# Patient Record
Sex: Female | Born: 1935 | Race: White | Hispanic: No | Marital: Single
Health system: Southern US, Community
[De-identification: ages and names within clinical notes are randomized; demographics above are authoritative.]

---

## 2007-07-05 DIAGNOSIS — G5600 Carpal tunnel syndrome, unspecified upper limb: Secondary | ICD-10-CM | POA: Insufficient documentation

## 2018-05-18 ENCOUNTER — Other Ambulatory Visit: Payer: Self-pay | Admitting: Family Medicine

## 2018-05-18 ENCOUNTER — Other Ambulatory Visit (HOSPITAL_COMMUNITY): Payer: Self-pay | Admitting: Family Medicine

## 2018-05-18 DIAGNOSIS — R55 Syncope and collapse: Secondary | ICD-10-CM

## 2018-05-24 ENCOUNTER — Ambulatory Visit (HOSPITAL_COMMUNITY)
Admission: RE | Admit: 2018-05-24 | Discharge: 2018-05-24 | Disposition: A | Discharge status: Home / Self Care | Payer: Medicare HMO | Source: Ambulatory Visit | Attending: Family Medicine | Admitting: Family Medicine

## 2018-05-24 DIAGNOSIS — R55 Syncope and collapse: Secondary | ICD-10-CM | POA: Diagnosis present

## 2018-05-24 DIAGNOSIS — I6523 Occlusion and stenosis of bilateral carotid arteries: Secondary | ICD-10-CM | POA: Diagnosis not present

## 2018-05-25 ENCOUNTER — Other Ambulatory Visit (HOSPITAL_COMMUNITY): Payer: Self-pay | Admitting: Family Medicine

## 2018-05-25 DIAGNOSIS — R55 Syncope and collapse: Secondary | ICD-10-CM

## 2018-06-04 ENCOUNTER — Ambulatory Visit (HOSPITAL_COMMUNITY): Payer: Medicare HMO

## 2019-07-19 IMAGING — US US CAROTID DUPLEX BILAT
1 series · 13 of 24 positions shown · non-contrast
Comparison: None.

CLINICAL DATA: Syncope

EXAM:
BILATERAL CAROTID DUPLEX ULTRASOUND
TECHNIQUE: Gray scale imaging, color Doppler and duplex ultrasound were
performed of bilateral carotid and vertebral arteries in the neck.

[Series 1: us carotid duplex bilat · 13 of 69 slices shown]
[im 1/69]
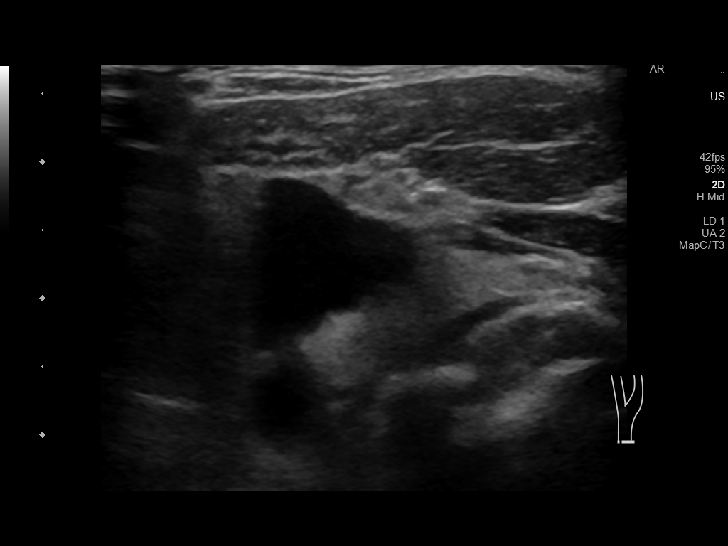
[im 6/69]
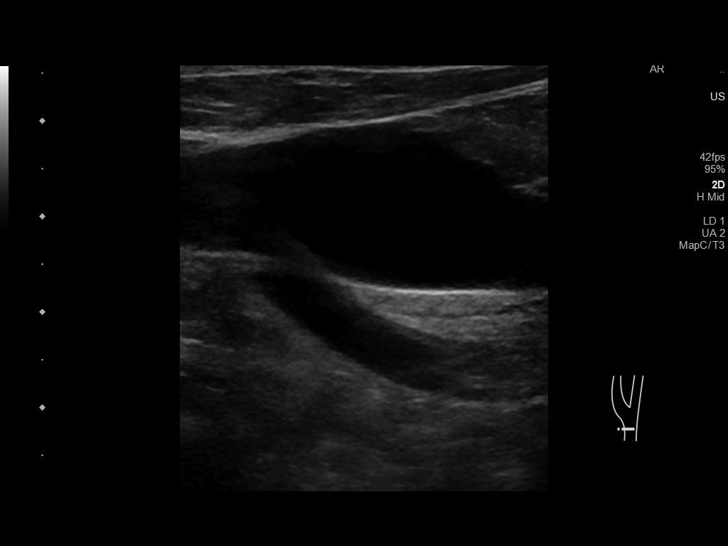
[im 12/69]
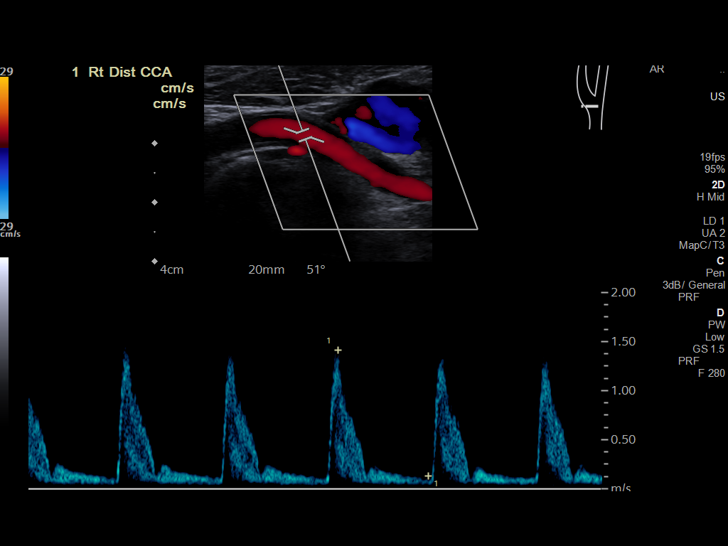
[im 18/69]
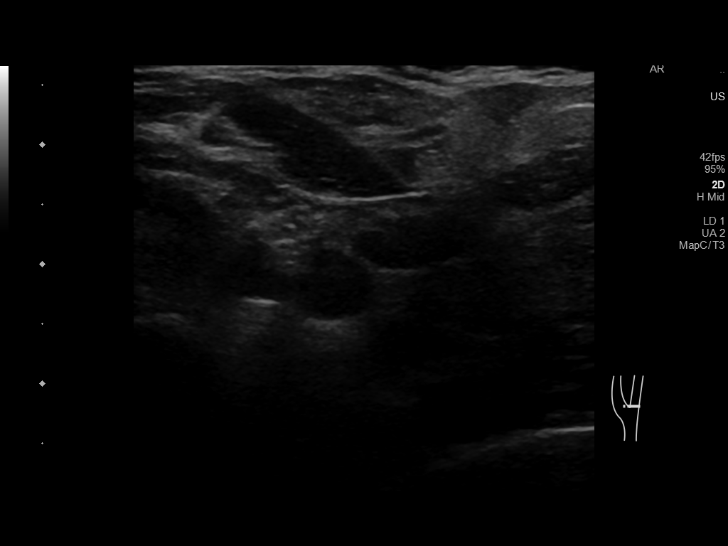
[im 24/69]
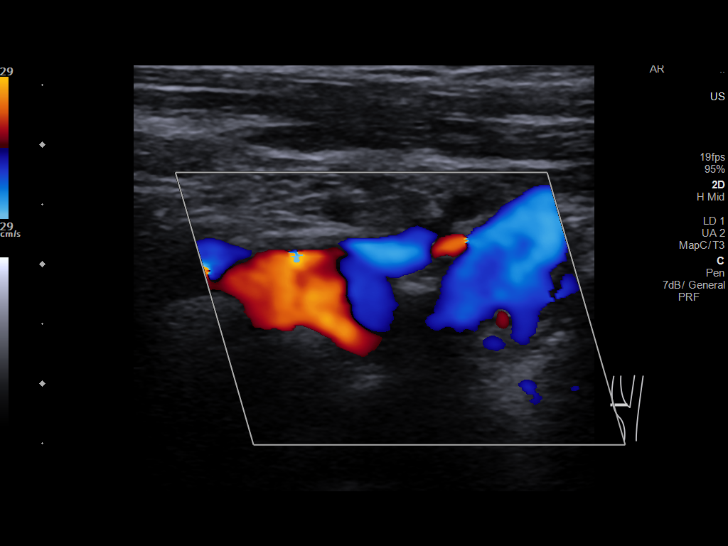
[im 30/69]
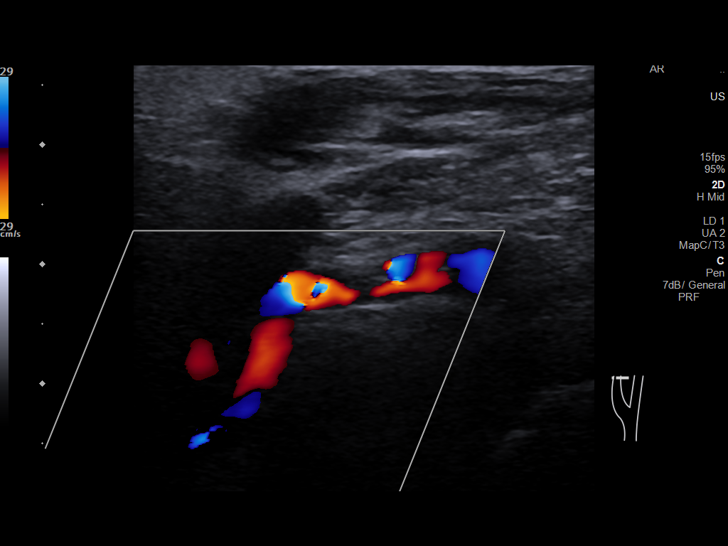
[im 36/69]
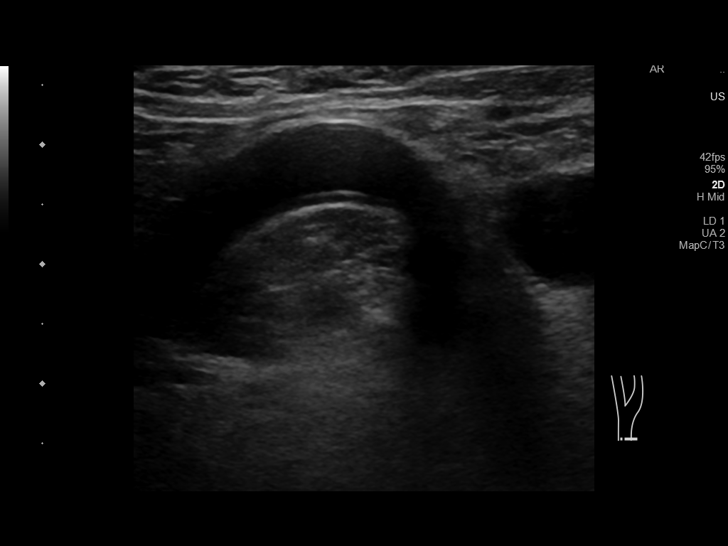
[im 39/69]
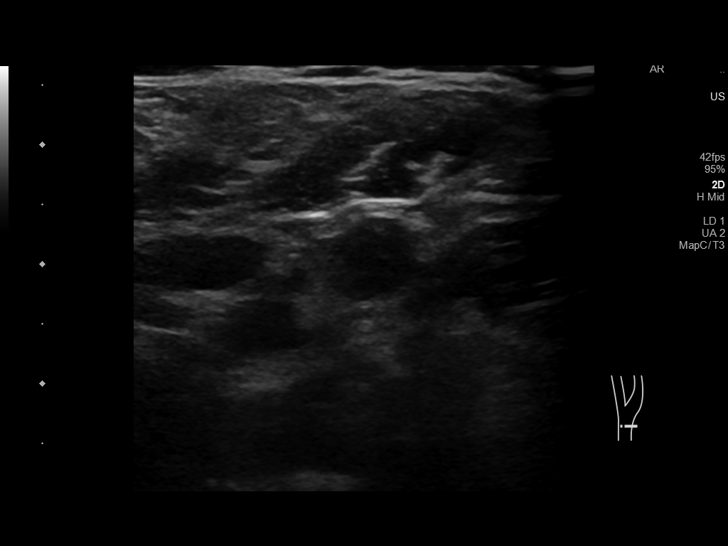
[im 45/69]
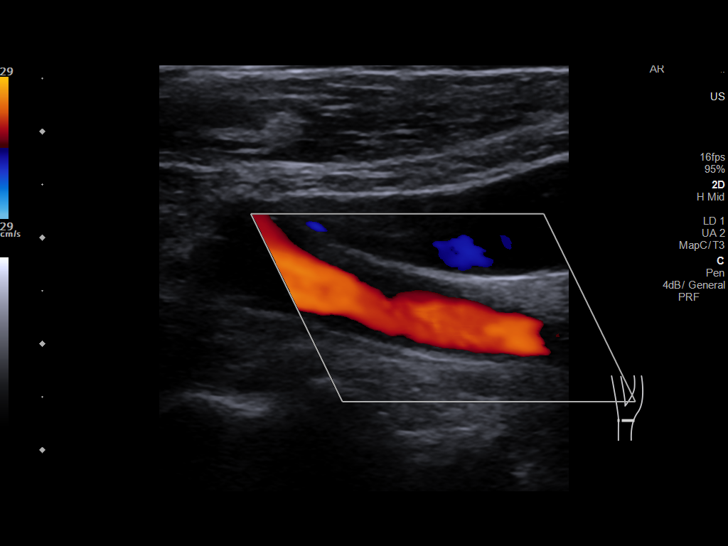
[im 51/69]
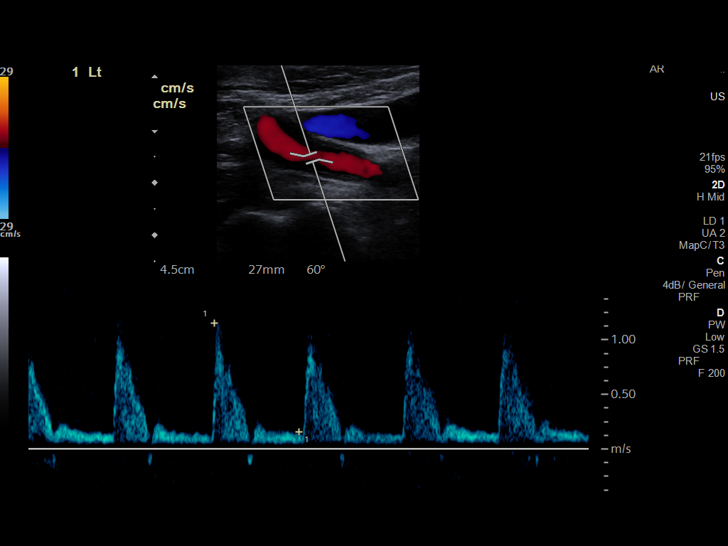
[im 57/69]
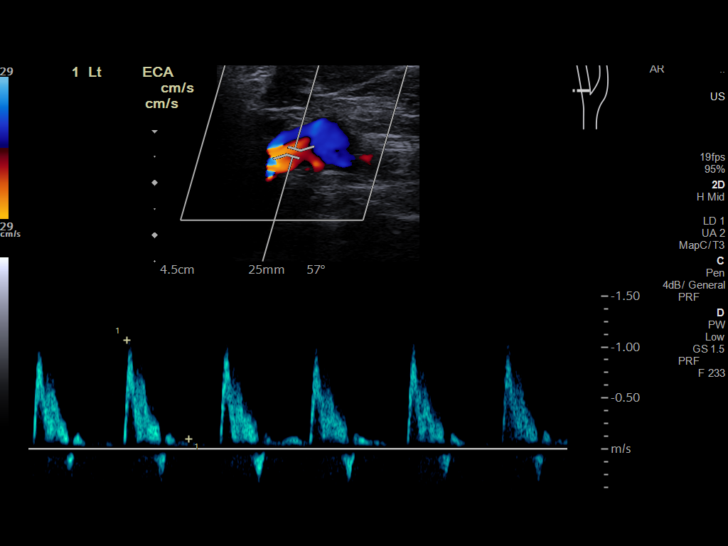
[im 63/69]
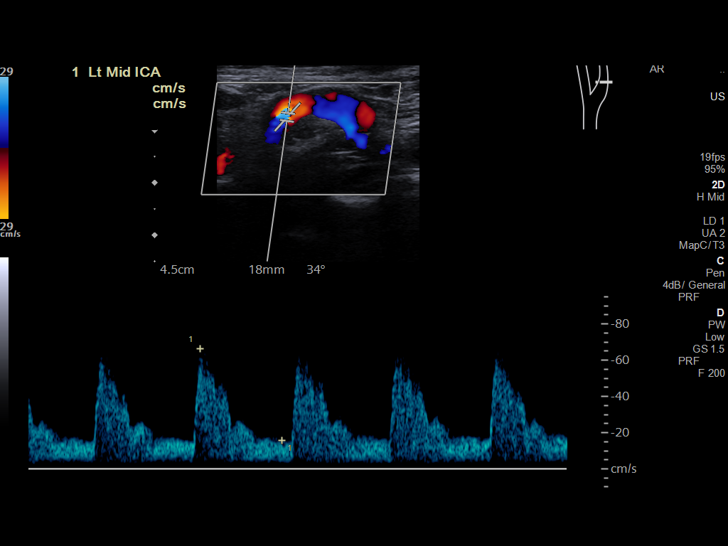
[im 69/69]
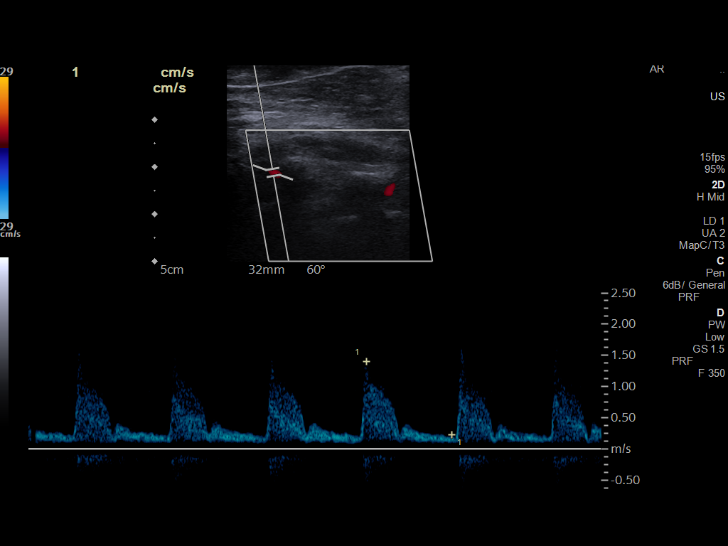

[13 of 24 positions shown; findings below may reference images not displayed]

FINDINGS: Criteria: Quantification of carotid stenosis is based on velocity
parameters that correlate the residual internal carotid diameter
with NASCET-based stenosis levels, using the diameter of the distal
internal carotid lumen as the denominator for stenosis measurement.

The following velocity measurements were obtained:

RIGHT

ICA: 108/24 cm/sec

CCA: 128/13 cm/sec

SYSTOLIC ICA/CCA RATIO:

ECA: 74 cm/sec

LEFT

ICA: 106/16 cm/sec

CCA: 119/13 cm/sec

SYSTOLIC ICA/CCA RATIO:

ECA: 107 cm/sec

RIGHT CAROTID ARTERY: Mild tortuosity. Calcified plaque in the
proximal ICA with some distal acoustic shadowing. Normal waveforms
and color Doppler signal.

RIGHT VERTEBRAL ARTERY:  Normal flow direction and waveform.

LEFT CAROTID ARTERY: Moderate tortuosity. Mild eccentric
nonocclusive plaque in the proximal ICA. Normal waveforms and color
Doppler signal.

LEFT VERTEBRAL ARTERY:  Normal flow direction and waveform.
IMPRESSION: 1. Mild bilateral proximal ICA plaque resulting in less than 50%
diameter stenosis.
2.  Antegrade bilateral vertebral arterial flow.

## 2019-11-14 ENCOUNTER — Ambulatory Visit: Payer: Medicare HMO | Attending: Internal Medicine

## 2019-11-14 DIAGNOSIS — Z23 Encounter for immunization: Secondary | ICD-10-CM

## 2019-11-14 NOTE — Progress Notes (Signed)
   Covid-19 Vaccination Clinic  Name:  Angela Mclaughlin    MRN: [id number] DOB: [birth year]  11/14/2019  Angela Mclaughlin was observed post Covid-19 immunization for 15 minutes without incident. She was provided with Vaccine Information Sheet and instruction to access the V-Safe system.   Angela Mclaughlin was instructed to call 911 with any severe reactions post vaccine: Marland Kitchen Difficulty breathing  . Swelling of face and throat  . A fast heartbeat  . A bad rash all over body  . Dizziness and weakness   Immunizations Administered    Name Date Dose VIS Date Route   Moderna COVID-19 Vaccine 11/14/2019 11:59 AM 0.5 mL 06/2019 Intramuscular   Manufacturer: Moderna   Lot: [id number]   NDC: 36122-449-75

## 2019-12-12 ENCOUNTER — Ambulatory Visit: Payer: Medicare HMO | Attending: Internal Medicine

## 2019-12-12 DIAGNOSIS — Z23 Encounter for immunization: Secondary | ICD-10-CM

## 2019-12-12 NOTE — Progress Notes (Signed)
   Covid-19 Vaccination Clinic  Name:  Angela Mclaughlin    MRN: [id number] DOB: February 23, 1935  12/12/2019  Angela Mclaughlin was observed post Covid-19 immunization for 15 minutes without incident. She was provided with Vaccine Information Sheet and instruction to access the V-Safe system.   Angela Mclaughlin was instructed to call 911 with any severe reactions post vaccine: Marland Kitchen Difficulty breathing  . Swelling of face and throat  . A fast heartbeat  . A bad rash all over body  . Dizziness and weakness   Immunizations Administered    Name Date Dose VIS Date Route   Moderna COVID-19 Vaccine 12/12/2019 12:22 PM 0.5 mL 06/2019 Intramuscular   Manufacturer: Moderna   Lot: [id number]   NDC: 95320-233-43

## 2022-11-08 DIAGNOSIS — M79676 Pain in unspecified toe(s): Secondary | ICD-10-CM | POA: Diagnosis not present

## 2022-11-08 DIAGNOSIS — B351 Tinea unguium: Secondary | ICD-10-CM | POA: Diagnosis not present

## 2022-11-21 DIAGNOSIS — R0982 Postnasal drip: Secondary | ICD-10-CM | POA: Diagnosis not present

## 2022-11-21 DIAGNOSIS — J00 Acute nasopharyngitis [common cold]: Secondary | ICD-10-CM | POA: Diagnosis not present

## 2023-03-30 ENCOUNTER — Encounter: Payer: Self-pay | Admitting: Family Medicine

## 2023-03-30 ENCOUNTER — Ambulatory Visit (INDEPENDENT_AMBULATORY_CARE_PROVIDER_SITE_OTHER): Payer: 59 | Admitting: Family Medicine

## 2023-03-30 VITALS — BP 153/73 | HR 81 | Temp 98.7°F | Ht 63.0 in | Wt 145.0 lb

## 2023-03-30 DIAGNOSIS — R2689 Other abnormalities of gait and mobility: Secondary | ICD-10-CM

## 2023-03-30 DIAGNOSIS — Z741 Need for assistance with personal care: Secondary | ICD-10-CM | POA: Diagnosis not present

## 2023-03-30 DIAGNOSIS — Z136 Encounter for screening for cardiovascular disorders: Secondary | ICD-10-CM

## 2023-03-30 DIAGNOSIS — R030 Elevated blood-pressure reading, without diagnosis of hypertension: Secondary | ICD-10-CM

## 2023-03-30 DIAGNOSIS — R7303 Prediabetes: Secondary | ICD-10-CM | POA: Diagnosis not present

## 2023-03-30 DIAGNOSIS — R600 Localized edema: Secondary | ICD-10-CM

## 2023-03-30 DIAGNOSIS — R051 Acute cough: Secondary | ICD-10-CM | POA: Diagnosis not present

## 2023-03-30 DIAGNOSIS — R6889 Other general symptoms and signs: Secondary | ICD-10-CM | POA: Diagnosis not present

## 2023-03-30 DIAGNOSIS — Z1331 Encounter for screening for depression: Secondary | ICD-10-CM

## 2023-03-30 LAB — CBC WITH DIFFERENTIAL/PLATELET
Basophils Absolute: 0 10*3/uL (ref 0.0–0.2)
Basos: 0 %
EOS (ABSOLUTE): 0.1 10*3/uL (ref 0.0–0.4)
Eos: 1 %
Hematocrit: 35.9 % (ref 34.0–46.6)
Hemoglobin: 11.5 g/dL (ref 11.1–15.9)
Immature Grans (Abs): 0 10*3/uL (ref 0.0–0.1)
Immature Granulocytes: 0 %
Lymphocytes Absolute: 1.2 10*3/uL (ref 0.7–3.1)
Lymphs: 23 %
MCH: 26.6 pg (ref 26.6–33.0)
MCHC: 32 g/dL (ref 31.5–35.7)
MCV: 83 fL (ref 79–97)
Monocytes Absolute: 0.5 10*3/uL (ref 0.1–0.9)
Monocytes: 9 %
Neutrophils Absolute: 3.6 10*3/uL (ref 1.4–7.0)
Neutrophils: 67 %
Platelets: 177 10*3/uL (ref 150–450)
RBC: 4.33 x10E6/uL (ref 3.77–5.28)
RDW: 13 % (ref 11.7–15.4)
WBC: 5.4 10*3/uL (ref 3.4–10.8)

## 2023-03-30 LAB — LIPID PANEL
Chol/HDL Ratio: 4.8 ratio — ABNORMAL HIGH (ref 0.0–4.4)
Cholesterol, Total: 227 mg/dL — ABNORMAL HIGH (ref 100–199)
HDL: 47 mg/dL (ref >39)
LDL Chol Calc (NIH): 167 mg/dL — ABNORMAL HIGH (ref 0–99)
Triglycerides: 72 mg/dL (ref 0–149)
VLDL Cholesterol Cal: 13 mg/dL (ref 5–40)

## 2023-03-30 LAB — CMP14+EGFR
ALT: 12 IU/L (ref 0–32)
AST: 17 IU/L (ref 0–40)
Albumin: 4.1 g/dL (ref 3.7–4.7)
Alkaline Phosphatase: 71 IU/L (ref 44–121)
BUN/Creatinine Ratio: 15 (ref 12–28)
BUN: 12 mg/dL (ref 8–27)
Bilirubin Total: 0.3 mg/dL (ref 0.0–1.2)
CO2: 26 mmol/L (ref 20–29)
Chloride: 102 mmol/L (ref 96–106)
Creatinine, Ser: 0.79 mg/dL (ref 0.57–1.00)
Globulin, Total: 2.8 g/dL (ref 1.5–4.5)
Glucose: 91 mg/dL (ref 70–99)
Potassium: 3.8 mmol/L (ref 3.5–5.2)
Sodium: 140 mmol/L (ref 134–144)
Total Protein: 6.9 g/dL (ref 6.0–8.5)
eGFR: 73 mL/min/{1.73_m2} (ref >59)

## 2023-03-30 LAB — VITAMIN D 25 HYDROXY (VIT D DEFICIENCY, FRACTURES)

## 2023-03-30 LAB — BAYER DCA HB A1C WAIVED: HB A1C (BAYER DCA - WAIVED): 6 % — ABNORMAL HIGH (ref 4.8–5.6)

## 2023-03-30 LAB — TSH

## 2023-03-30 LAB — VITAMIN B12

## 2023-03-30 MED ORDER — GUAIFENESIN ER 600 MG PO TB12
600.0000 mg | ORAL_TABLET | Freq: Two times a day (BID) | ORAL | 0 refills | Status: AC
Start: 2023-03-30 — End: ?

## 2023-03-30 NOTE — Patient Instructions (Signed)
Monitoring your BP at home   Your BP was elevated today in office  Please keep a log of your BP at home.  The best time to take BP is 1st thing in the morning after waking.  Sit for 5 minutes with feet flat on the floor, arm at heart level.  Options for BP cuffs are at [employer], [employer], [facility], [facility] & [facility].  We will review measurements at follow up and determine plan for BP management.  If you have access, you can send a message in MyChart with your measurements prior to your follow up appointment.  The brand I recommend to get is Omron (Bronze).
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
## 2023-03-31 NOTE — Progress Notes (Signed)
Cholesterol is elevated. Diet encouraged - increase intake of fresh fruits and vegetables, increase intake of lean proteins. Bake, broil, or grill foods. Avoid fried, greasy, and fatty foods. Avoid fast foods. Increase intake of fiber-rich whole grains. Exercise encouraged - at least 150 minutes per week and advance as tolerated. Can also try red yeast rice and we will recheck in 3 months.  Vitamin B12 is elevated, if she is taking supplementation, may reduce it.  Remains in prediabetes. All other labs normal.   The ASCVD Risk score (Arnett DK, et al., 2019) failed to calculate for the following reasons:   The 2019 ASCVD risk score is only valid for ages 11 to 50

## 2023-04-19 NOTE — Progress Notes (Signed)
Patient is aware and reports she will call her insurance company and let up know what is needed from [place].

## 2023-05-02 ENCOUNTER — Ambulatory Visit (INDEPENDENT_AMBULATORY_CARE_PROVIDER_SITE_OTHER): Payer: 59 | Admitting: Family Medicine

## 2023-05-02 ENCOUNTER — Encounter: Payer: Self-pay | Admitting: Family Medicine

## 2023-05-02 VITALS — BP 129/70 | HR 71 | Temp 98.9°F | Ht 63.0 in | Wt 148.0 lb

## 2023-05-02 DIAGNOSIS — R600 Localized edema: Secondary | ICD-10-CM | POA: Diagnosis not present

## 2023-05-02 DIAGNOSIS — Z741 Need for assistance with personal care: Secondary | ICD-10-CM | POA: Diagnosis not present

## 2023-05-02 DIAGNOSIS — R7303 Prediabetes: Secondary | ICD-10-CM

## 2023-05-02 DIAGNOSIS — R2689 Other abnormalities of gait and mobility: Secondary | ICD-10-CM

## 2023-05-02 DIAGNOSIS — L819 Disorder of pigmentation, unspecified: Secondary | ICD-10-CM | POA: Diagnosis not present

## 2023-05-02 DIAGNOSIS — R351 Nocturia: Secondary | ICD-10-CM

## 2023-05-02 DIAGNOSIS — Z1331 Encounter for screening for depression: Secondary | ICD-10-CM

## 2023-05-02 MED ORDER — OXYBUTYNIN CHLORIDE ER 5 MG PO TB24: 5.0000 mg | ORAL_TABLET | Freq: Every day | ORAL | 0 refills | Status: DC

## 2023-05-02 NOTE — Progress Notes (Signed)
Subjective:  Patient ID: Angela Mclaughlin, female    DOB: [birth year], 88 y.o.   MRN: [id number]  Patient Care Team: Arrie Senate, FNP as PCP - General (Family Medicine)   Chief Complaint:  Medical Management of Chronic Issues  HPI: Angela Mclaughlin is a 88 y.o. female presenting on 05/02/2023 for Medical Management of Chronic Issues 1. Prediabetes States that she is not currently cooking for herself. She is relying on other people for her food. She hopes to get in with PT and increase her strength so that she is able to cook. She reports that she has kept her A1C in prediabetes range for several years.    2. Bilateral lower extremity edema States that they feel slightly better. She is more concerned abut the skin coloration than the swelling. She does not wear compression stockings. She has not followed up with vascular at this time. She is waiting for her neighbor to coordinate schedules with her so that she can have a ride to the appt.   3. Imbalance/Requires assistance with activities of daily living (ADL) Has referral with PT, needs to make an appt. Continues to use bilateral canes. She denies any falls. She is not able to complete her ADLs. Patient was brought in today by her neighbor who provides some of patient history. Patient has one son that she sees on occasion, he is coming over on Thursday.   4. Encounter for screening for depression Denies depression. Denies thought of self harm.   5. Urinary frequency  States that she can control her bladder, but she is up every night every 2 hours. Reports that this started 3-4 yrs ago. Denies any change in odor, pain with urination, or low back pain.   Relevant past medical, surgical, family, and social history reviewed and updated as indicated.  Allergies and medications reviewed and updated. Data reviewed: Chart in Epic.   No past medical history on file.  No past surgical history on file.  Social History    Socioeconomic History   Marital status: Single    Spouse name: Not on file   Number of children: Not on file   Years of education: Not on file   Highest education level: Not on file  Occupational History   Not on file  Tobacco Use   Smoking status: Never   Smokeless tobacco: Never  Substance and Sexual Activity   Alcohol use: Not Currently   Drug use: Not Currently   Sexual activity: Not Currently  Other Topics Concern   Not on file  Social History Narrative   Not on file   Social Determinants of Health   Financial Resource Strain: Not on file  Food Insecurity: Not on file  Transportation Needs: Not on file  Physical Activity: Not on file  Stress: Not on file  Social Connections: Not on file  Intimate Partner Violence: Not on file    Outpatient Encounter Medications as of 05/02/2023  Medication Sig   CALCIUM PO Take by mouth.   guaiFENesin (MUCINEX) 600 MG 12 hr tablet Take 1 tablet (600 mg total) by mouth 2 (two) times daily.   Multiple Vitamins-Minerals (MULTIVITAMIN WOMEN 50+ PO) Take by mouth.   POTASSIUM PO Take by mouth.   VITAMIN D PO Take by mouth.   No facility-administered encounter medications on file as of 05/02/2023.    No Known Allergies  Review of Systems As per HPI Objective:  BP 129/70   Pulse 71   Temp 98.9  F (37.2 C)   Ht 5\' 3"  (1.6 m)   Wt 148 lb (67.1 kg)   SpO2 98%   BMI 26.22 kg/m    Wt Readings from Last 3 Encounters:  05/02/23 148 lb (67.1 kg)  03/30/23 145 lb (65.8 kg)   Physical Exam Constitutional:      General: She is awake. She is not in acute distress.    Appearance: Normal appearance. She is well-developed and well-groomed. She is not ill-appearing, toxic-appearing or diaphoretic.  Eyes:     Comments: Wears glasses   Cardiovascular:     Rate and Rhythm: Normal rate and regular rhythm.     Pulses: Normal pulses.          Radial pulses are 2+ on the right side and 2+ on the left side.       Posterior tibial  pulses are 2+ on the right side and 2+ on the left side.     Heart sounds: Normal heart sounds. No murmur heard.    No gallop.  Pulmonary:     Effort: Pulmonary effort is normal. No respiratory distress.     Breath sounds: Normal breath sounds. No stridor. No wheezing, rhonchi or rales.  Musculoskeletal:     Cervical back: Full passive range of motion without pain and neck supple.     Right lower leg: 1+ Edema present.     Left lower leg: 1+ Edema present.     Comments: Generalized weakness, uses canes to walk  Skin:    General: Skin is warm.     Capillary Refill: Capillary refill takes less than 2 seconds.  Neurological:     General: No focal deficit present.     Mental Status: She is alert, oriented to person, place, and time and easily aroused. Mental status is at baseline.     GCS: GCS eye subscore is 4. GCS verbal subscore is 5. GCS motor subscore is 6.     Motor: No weakness.  Psychiatric:        Attention and Perception: Attention and perception normal.        Mood and Affect: Mood and affect normal.        Speech: Speech normal.        Behavior: Behavior normal. Behavior is cooperative.        Thought Content: Thought content normal. Thought content does not include homicidal or suicidal ideation. Thought content does not include homicidal or suicidal plan.        Cognition and Memory: Cognition and memory normal.        Judgment: Judgment normal.      Results for orders placed or performed in visit on 03/30/23  Bayer DCA Hb A1c Waived  Result Value Ref Range   HB A1C (BAYER DCA - WAIVED) 6.0 (H) 4.8 - 5.6 %  CBC with Differential/Platelet  Result Value Ref Range   WBC 5.4 3.4 - 10.8 x10E3/uL   RBC 4.33 3.77 - 5.28 x10E6/uL   Hemoglobin 11.5 11.1 - 15.9 g/dL   Hematocrit 84.6 96.2 - 46.6 %   MCV 83 79 - 97 fL   MCH 26.6 26.6 - 33.0 pg   MCHC 32.0 31.5 - 35.7 g/dL   RDW 95.2 84.1 - 32.4 %   Platelets 177 150 - 450 x10E3/uL   Neutrophils 67 Not Estab. %   Lymphs  23 Not Estab. %   Monocytes 9 Not Estab. %   Eos 1 Not Estab. %   Basos  0 Not Estab. %   Neutrophils Absolute 3.6 1.4 - 7.0 x10E3/uL   Lymphocytes Absolute 1.2 0.7 - 3.1 x10E3/uL   Monocytes Absolute 0.5 0.1 - 0.9 x10E3/uL   EOS (ABSOLUTE) 0.1 0.0 - 0.4 x10E3/uL   Basophils Absolute 0.0 0.0 - 0.2 x10E3/uL   Immature Granulocytes 0 Not Estab. %   Immature Grans (Abs) 0.0 0.0 - 0.1 x10E3/uL  CMP14+EGFR  Result Value Ref Range   Glucose 91 70 - 99 mg/dL   BUN 12 8 - 27 mg/dL   Creatinine, Ser 8.29 0.57 - 1.00 mg/dL   eGFR 73 >93 ZJ/IRC/7.89   BUN/Creatinine Ratio 15 12 - 28   Sodium 140 134 - 144 mmol/L   Potassium 3.8 3.5 - 5.2 mmol/L   Chloride 102 96 - 106 mmol/L   CO2 26 20 - 29 mmol/L   Calcium 9.4 8.7 - 10.3 mg/dL   Total Protein 6.9 6.0 - 8.5 g/dL   Albumin 4.1 3.7 - 4.7 g/dL   Globulin, Total 2.8 1.5 - 4.5 g/dL   Bilirubin Total 0.3 0.0 - 1.2 mg/dL   Alkaline Phosphatase 71 44 - 121 IU/L   AST 17 0 - 40 IU/L   ALT 12 0 - 32 IU/L  Lipid panel  Result Value Ref Range   Cholesterol, Total 227 (H) 100 - 199 mg/dL   Triglycerides 72 0 - 149 mg/dL   HDL 47 >38 mg/dL   VLDL Cholesterol Cal 13 5 - 40 mg/dL   LDL Chol Calc (NIH) 101 (H) 0 - 99 mg/dL   Chol/HDL Ratio 4.8 (H) 0.0 - 4.4 ratio  TSH  Result Value Ref Range   TSH 1.160 0.450 - 4.500 uIU/mL  VITAMIN D 25 Hydroxy (Vit-D Deficiency, Fractures)  Result Value Ref Range   Vit D, 25-Hydroxy 50.6 30.0 - 100.0 ng/mL  Vitamin B12  Result Value Ref Range   Vitamin B-12 >2000 (H) 232 - 1245 pg/mL       03/30/2023   10:37 AM  Depression screen PHQ 2/9  Decreased Interest 0  Down, Depressed, Hopeless 0  PHQ - 2 Score 0  Altered sleeping 3  Tired, decreased energy 3  Change in appetite 0  Feeling bad or failure about yourself  0  Trouble concentrating 0  Moving slowly or fidgety/restless 0  PHQ-9 Score 6  PHQ-9 Score 13 on 05/02/23 - no thoughts of self harm.      05/02/2023    2:28 PM 03/30/2023   10:38 AM   GAD 7 : Generalized Anxiety Score  Nervous, Anxious, on Edge 0 0  Control/stop worrying 0 0  Worry too much - different things 0 0  Trouble relaxing 0 1  Restless 0 0  Easily annoyed or irritable 0 0  Afraid - awful might happen 0 0  Total GAD 7 Score 0 1  Anxiety Difficulty  Very difficult   Pertinent labs & imaging results that were available during my care of the patient were reviewed by me and considered in my medical decision making.  Assessment & Plan:  Jazzalyn was seen today for medical management of chronic issues.  Diagnoses and all orders for this visit:  Prediabetes Well controlled at last check. Will recheck annually. Reviewed labs with patient during visit today as she did not have access to mychart and did not know results.   Bilateral lower extremity edema Discussed with patient compression stockings, elevating lower extremities, and ambulating as tolerated. Referral placed as below per patient request.  -  Ambulatory referral to Dermatology  Imbalance Previously referred to PT.  Provided patient with contact information for her and her neighbor to coordinate appt.   Requires assistance with activities of daily living (ADL) As above.   Encounter for screening for depression Pt screened positive for depression today. Pt offered nonpharmacologic and pharmacologic therapy. Pt declined initiating treatment at this time. Safety contract established today with patient in clinic. Denies intent to harm herself or others. Pt to notify provider if they would like to initiate treatment.   Hyperpigmentation of skin Referral placed as below per patient request. Provided patient with phone number to follow up with vascular.  -     Ambulatory referral to Dermatology  Nocturia Will start medication as below. Discussed side effects especially given patient age and that she lives alone.  - oxybutynin (DITROPAN-XL) 5 MG 24 hr tablet; Take 1 tablet (5 mg total) by mouth at  bedtime.  Dispense: 60 tablet; Refill: 0  Continue all other maintenance medications.  Follow up plan: Return in about 3 months (around 08/02/2023) for Chronic Condition Follow up.   Continue healthy lifestyle choices, including diet (rich in fruits, vegetables, and lean proteins, and low in salt and simple carbohydrates) and exercise (at least 30 minutes of moderate physical activity daily).  Written and verbal instructions provided   The above assessment and management plan was discussed with the patient. The patient verbalized understanding of and has agreed to the management plan. Patient is aware to call the clinic if they develop any new symptoms or if symptoms persist or worsen. Patient is aware when to return to the clinic for a follow-up visit. Patient educated on when it is appropriate to go to the emergency department.   Neale Burly, DNP-FNP [facility] [address] [place], [place] [address] [phone]

## 2023-05-02 NOTE — Patient Instructions (Addendum)
[facility] [address], [address] [phone]   [facility] [address] [place] [address] [phone]

## 2023-08-08 ENCOUNTER — Ambulatory Visit: Payer: 59 | Admitting: Family Medicine

## 2023-08-08 ENCOUNTER — Encounter: Payer: Self-pay | Admitting: Family Medicine

## 2023-08-08 VITALS — BP 141/71 | HR 83 | Temp 98.7°F | Ht 63.0 in | Wt 147.0 lb

## 2023-08-08 DIAGNOSIS — R2689 Other abnormalities of gait and mobility: Secondary | ICD-10-CM

## 2023-08-08 DIAGNOSIS — Z5982 Transportation insecurity: Secondary | ICD-10-CM | POA: Insufficient documentation

## 2023-08-08 DIAGNOSIS — R7303 Prediabetes: Secondary | ICD-10-CM | POA: Diagnosis not present

## 2023-08-08 DIAGNOSIS — L819 Disorder of pigmentation, unspecified: Secondary | ICD-10-CM

## 2023-08-08 DIAGNOSIS — R030 Elevated blood-pressure reading, without diagnosis of hypertension: Secondary | ICD-10-CM

## 2023-08-08 DIAGNOSIS — Z741 Need for assistance with personal care: Secondary | ICD-10-CM | POA: Diagnosis not present

## 2023-08-08 DIAGNOSIS — R351 Nocturia: Secondary | ICD-10-CM | POA: Diagnosis not present

## 2023-08-08 MED ORDER — OXYBUTYNIN CHLORIDE ER 5 MG PO TB24: 5.0000 mg | ORAL_TABLET | Freq: Every day | ORAL | 0 refills | Status: AC

## 2023-08-08 NOTE — Patient Instructions (Addendum)
Dr. Scharlene Gloss Dermatology Office [address] [place] [address] [phone]  [facility] [address] [place] [address] [phone]

## 2023-08-08 NOTE — Progress Notes (Signed)
 Subjective:  Patient ID: Angela Mclaughlin, female    DOB: 09/28/1934, 89 y.o.   MRN: [id number]  Patient Care Team: Cathlene Marry Lenis, FNP as PCP - General (Family Medicine)   Chief Complaint:  Medical Management of Chronic Issues (3 month follow up/)  HPI: Angela Mclaughlin is a 89 y.o. female presenting on 08/08/2023 for Medical Management of Chronic Issues (3 month follow up/)  HPI 1. Prediabetes States that she has not made any changes to her diet. She states that she exercises at home. She gets up and does supported squats. She stretches and pumps her legs.   2. Imbalance/3. Requires assistance with activities of daily living (ADL) She did not get Handicap placard and is not sure where she placed the information. She would like a new paperwork. States that she has not followed up with PT. She is using bilateral walking sticks. She does not wish to use a wheelchair.   4. Nocturia States that she is still going to the bathroom every 2-3 hours at night. She did not pick up ditropan  to start.   5. Elevated BP without diagnosis of hypertension Has BP monitor at home No ROS Denies anxiety, fatigue, chest pain, headaches, palpitations, sweats, SOB, PND, orthopnea Endorses peripheral edema, changes to vision Supposed to follow up with eye doctor for cataracts.  Meds none  CAD risks none  6. BLE hyperpigmentation/Edema She was previously referred to dermatology for follow up. She has not followed up with them.  States that she feels they are getting better. She does not not wear compression stocking. States that she does not elevate her feet as it causes them to have more spasm.    Relevant past medical, surgical, family, and social history reviewed and updated as indicated.  Allergies and medications reviewed and updated. Data reviewed: Chart in Epic.  No past medical history on file.  No past surgical history on file.  Social History   Socioeconomic History   Marital  status: Single    Spouse name: Not on file   Number of children: Not on file   Years of education: Not on file   Highest education level: Not on file  Occupational History   Not on file  Tobacco Use   Smoking status: Never   Smokeless tobacco: Never  Substance and Sexual Activity   Alcohol use: Not Currently   Drug use: Not Currently   Sexual activity: Not Currently  Other Topics Concern   Not on file  Social History Narrative   Not on file   Social Drivers of Health   Financial Resource Strain: Not on file  Food Insecurity: Not on file  Transportation Needs: Not on file  Physical Activity: Not on file  Stress: Not on file  Social Connections: Not on file  Intimate Partner Violence: Not on file    Outpatient Encounter Medications as of 08/08/2023  Medication Sig   CALCIUM PO Take by mouth.   guaiFENesin  (MUCINEX ) 600 MG 12 hr tablet Take 1 tablet (600 mg total) by mouth 2 (two) times daily.   Multiple Vitamins-Minerals (MULTIVITAMIN WOMEN 50+ PO) Take by mouth.   oxybutynin  (DITROPAN -XL) 5 MG 24 hr tablet Take 1 tablet (5 mg total) by mouth at bedtime.   POTASSIUM PO Take by mouth.   VITAMIN D  PO Take by mouth.   No facility-administered encounter medications on file as of 08/08/2023.    No Known Allergies  Review of Systems As per HPI  Objective:  BP (!) 150/66   Pulse 83   Temp 98.7 F (37.1 C)   Ht 5' 3 (1.6 m)   Wt 147 lb (66.7 kg)   SpO2 97%   BMI 26.04 kg/m    Wt Readings from Last 3 Encounters:  08/08/23 147 lb (66.7 kg)  05/02/23 148 lb (67.1 kg)  03/30/23 145 lb (65.8 kg)   Physical Exam Constitutional:      General: She is awake. She is not in acute distress.    Appearance: Normal appearance. She is well-developed, well-groomed and overweight. She is not ill-appearing, toxic-appearing or diaphoretic.  Cardiovascular:     Rate and Rhythm: Normal rate and regular rhythm.     Pulses: Normal pulses.          Radial pulses are 2+ on the right  side and 2+ on the left side.       Posterior tibial pulses are 2+ on the right side and 2+ on the left side.     Heart sounds: Normal heart sounds. No murmur heard.    No gallop.  Pulmonary:     Effort: Pulmonary effort is normal. No respiratory distress.     Breath sounds: Normal breath sounds. No stridor. No wheezing, rhonchi or rales.  Musculoskeletal:     Cervical back: Neck supple. Deformity present. Decreased range of motion.     Right lower leg: No edema.     Left lower leg: No edema.     Comments: Generalized weakness, using bilateral walking sticks.   Skin:    General: Skin is warm.     Capillary Refill: Capillary refill takes less than 2 seconds.  Neurological:     General: No focal deficit present.     Mental Status: She is alert, oriented to person, place, and time and easily aroused. Mental status is at baseline.     GCS: GCS eye subscore is 4. GCS verbal subscore is 5. GCS motor subscore is 6.     Motor: No weakness.  Psychiatric:        Attention and Perception: Attention and perception normal.        Mood and Affect: Mood and affect normal.        Speech: Speech normal.        Behavior: Behavior normal. Behavior is cooperative.        Thought Content: Thought content normal. Thought content does not include homicidal or suicidal ideation. Thought content does not include homicidal or suicidal plan.        Cognition and Memory: Cognition and memory normal.        Judgment: Judgment normal.     Results for orders placed or performed in visit on 03/30/23  Bayer DCA Hb A1c Waived   Collection Time: 03/30/23 10:13 AM  Result Value Ref Range   HB A1C (BAYER DCA - WAIVED) 6.0 (H) 4.8 - 5.6 %  CBC with Differential/Platelet   Collection Time: 03/30/23 10:15 AM  Result Value Ref Range   WBC 5.4 3.4 - 10.8 x10E3/uL   RBC 4.33 3.77 - 5.28 x10E6/uL   Hemoglobin 11.5 11.1 - 15.9 g/dL   Hematocrit 64.0 65.9 - 46.6 %   MCV 83 79 - 97 fL   MCH 26.6 26.6 - 33.0 pg   MCHC  32.0 31.5 - 35.7 g/dL   RDW 86.9 88.2 - 84.5 %   Platelets 177 150 - 450 x10E3/uL   Neutrophils 67 Not Estab. %   Lymphs 23 Not Estab. %  Monocytes 9 Not Estab. %   Eos 1 Not Estab. %   Basos 0 Not Estab. %   Neutrophils Absolute 3.6 1.4 - 7.0 x10E3/uL   Lymphocytes Absolute 1.2 0.7 - 3.1 x10E3/uL   Monocytes Absolute 0.5 0.1 - 0.9 x10E3/uL   EOS (ABSOLUTE) 0.1 0.0 - 0.4 x10E3/uL   Basophils Absolute 0.0 0.0 - 0.2 x10E3/uL   Immature Granulocytes 0 Not Estab. %   Immature Grans (Abs) 0.0 0.0 - 0.1 x10E3/uL  CMP14+EGFR   Collection Time: 03/30/23 10:15 AM  Result Value Ref Range   Glucose 91 70 - 99 mg/dL   BUN 12 8 - 27 mg/dL   Creatinine, Ser 9.20 0.57 - 1.00 mg/dL   eGFR 73 >40 fO/fpw/8.26   BUN/Creatinine Ratio 15 12 - 28   Sodium 140 134 - 144 mmol/L   Potassium 3.8 3.5 - 5.2 mmol/L   Chloride 102 96 - 106 mmol/L   CO2 26 20 - 29 mmol/L   Calcium 9.4 8.7 - 10.3 mg/dL   Total Protein 6.9 6.0 - 8.5 g/dL   Albumin 4.1 3.7 - 4.7 g/dL   Globulin, Total 2.8 1.5 - 4.5 g/dL   Bilirubin Total 0.3 0.0 - 1.2 mg/dL   Alkaline Phosphatase 71 44 - 121 IU/L   AST 17 0 - 40 IU/L   ALT 12 0 - 32 IU/L  Lipid panel   Collection Time: 03/30/23 10:15 AM  Result Value Ref Range   Cholesterol, Total 227 (H) 100 - 199 mg/dL   Triglycerides 72 0 - 149 mg/dL   HDL 47 >60 mg/dL   VLDL Cholesterol Cal 13 5 - 40 mg/dL   LDL Chol Calc (NIH) 832 (H) 0 - 99 mg/dL   Chol/HDL Ratio 4.8 (H) 0.0 - 4.4 ratio  TSH   Collection Time: 03/30/23 10:15 AM  Result Value Ref Range   TSH 1.160 0.450 - 4.500 uIU/mL  VITAMIN D  25 Hydroxy (Vit-D Deficiency, Fractures)   Collection Time: 03/30/23 10:15 AM  Result Value Ref Range   Vit D, 25-Hydroxy 50.6 30.0 - 100.0 ng/mL  Vitamin B12   Collection Time: 03/30/23 10:15 AM  Result Value Ref Range   Vitamin B-12 >2000 (H) 232 - 1245 pg/mL       08/08/2023    2:46 PM 03/30/2023   10:37 AM  Depression screen PHQ 2/9  Decreased Interest 0 0  Down,  Depressed, Hopeless 0 0  PHQ - 2 Score 0 0  Altered sleeping  3  Tired, decreased energy  3  Change in appetite  0  Feeling bad or failure about yourself   0  Trouble concentrating  0  Moving slowly or fidgety/restless  0  PHQ-9 Score  6       08/08/2023    2:46 PM 05/02/2023    2:28 PM 03/30/2023   10:38 AM  GAD 7 : Generalized Anxiety Score  Nervous, Anxious, on Edge 0 0 0  Control/stop worrying 0 0 0  Worry too much - different things 0 0 0  Trouble relaxing 0 0 1  Restless 0 0 0  Easily annoyed or irritable 0 0 0  Afraid - awful might happen 0 0 0  Total GAD 7 Score 0 0 1  Anxiety Difficulty   Very difficult    Pertinent labs & imaging results that were available during my care of the patient were reviewed by me and considered in my medical decision making.  Assessment & Plan:  Kindel was  seen today for medical management of chronic issues.  Diagnoses and all orders for this visit:  1. Prediabetes (Primary) Well controlled at last labs. Will repeat at follow up.   2. Imbalance Patient to follow up with PT. Provided contact information. Declines referral to home health for evaluation. They are interested in getting her a walk-in shower. They are looking into applying for a grant for placement. Will discuss with office staff if there are manners in which we can help facilitate this. Patient declines order for wheelchair. She plans to continue to follow up with chiropractor.   3. Requires assistance with activities of daily living (ADL) As above.   4. Nocturia Will resend medication as below for patient to trial.  - oxybutynin  (DITROPAN -XL) 5 MG 24 hr tablet; Take 1 tablet (5 mg total) by mouth at bedtime.  Dispense: 60 tablet; Refill: 0  5. Elevated BP without diagnosis of hypertension Elevated BP today in office. Discussed with patient monitoring BP at home. Will review measurements with patient at follow up and determine plan for BP management.  - For home use only  DME Other see comment  6. Hyperpigmentation of skin Improving. Encouraged patient to wear compression stockings, elevated legs. She declines at this time. Encouraged her to continue leg pumps and ambulation as tolerated. She agrees. Referral previously placed to dermatology. Provided contact information for patient.   7. Transportation insecurity Patient relies on her neighbor/friend, Zebedee and her son, Levander, to bring her to appts.    Continue all other maintenance medications.  Follow up plan: Return in about 3 months (around 11/05/2023) for Chronic Condition Follow up.   Continue healthy lifestyle choices, including diet (rich in fruits, vegetables, and lean proteins, and low in salt and simple carbohydrates) and exercise (at least 30 minutes of moderate physical activity daily).  Written and verbal instructions provided   The above assessment and management plan was discussed with the patient. The patient verbalized understanding of and has agreed to the management plan. Patient is aware to call the clinic if they develop any new symptoms or if symptoms persist or worsen. Patient is aware when to return to the clinic for a follow-up visit. Patient educated on when it is appropriate to go to the emergency department.   Marry Kins, DNP-FNP [facility] [address] [place], [place] [address] [phone]

## 2023-11-08 ENCOUNTER — Ambulatory Visit: Payer: 59 | Admitting: Family Medicine

## 2024-05-14 ENCOUNTER — Ambulatory Visit: Payer: Self-pay
